# Patient Record
Sex: Male | Born: 2000 | Race: Black or African American | Hispanic: No | Marital: Single | Smoking: Never smoker
Health system: Southern US, Community
[De-identification: ages and names within clinical notes are randomized; demographics above are authoritative.]

## PROBLEM LIST (undated history)

## (undated) ENCOUNTER — Ambulatory Visit (HOSPITAL_COMMUNITY): Admission: EM | Disposition: A | Discharge status: Home / Self Care | Payer: Medicaid Other

## (undated) DIAGNOSIS — Z8659 Personal history of other mental and behavioral disorders: Secondary | ICD-10-CM

## (undated) DIAGNOSIS — J302 Other seasonal allergic rhinitis: Secondary | ICD-10-CM

## (undated) DIAGNOSIS — F419 Anxiety disorder, unspecified: Secondary | ICD-10-CM

## (undated) HISTORY — DX: Personal history of other mental and behavioral disorders: Z86.59

## (undated) HISTORY — DX: Anxiety disorder, unspecified: F41.9

---
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
## 2020-01-08 ENCOUNTER — Other Ambulatory Visit: Payer: Medicaid Other

## 2020-01-08 ENCOUNTER — Other Ambulatory Visit: Payer: Self-pay

## 2020-01-08 DIAGNOSIS — Z20822 Contact with and (suspected) exposure to covid-19: Secondary | ICD-10-CM

## 2020-01-09 LAB — SARS-COV-2, NAA 2 DAY TAT

## 2020-01-09 LAB — NOVEL CORONAVIRUS, NAA: SARS-CoV-2, NAA: NOT DETECTED

## 2020-04-03 IMAGING — DX DG HAND COMPLETE 3+V*L*
3 series · 3 of 3 positions shown · non-contrast
Comparison: None.

CLINICAL DATA: Pain and bruising involving the second and third
metacarpals after an injury, initial encounter.

EXAM:
LEFT HAND - COMPLETE 3+ VIEW

[hand pa]
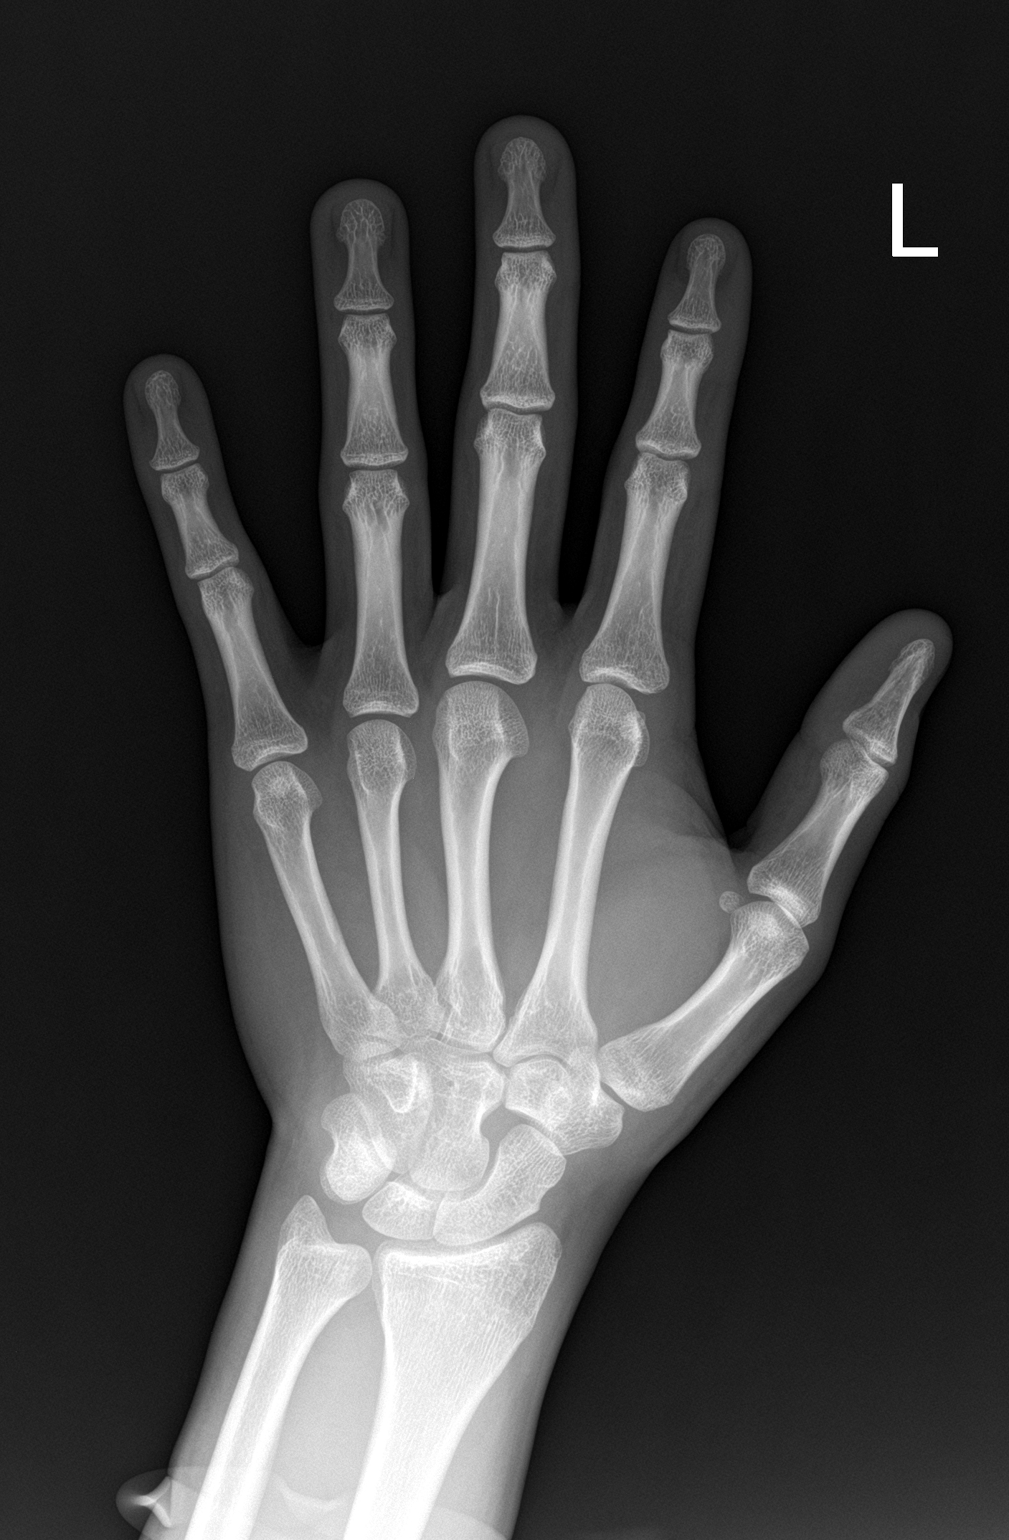

[hand obl]
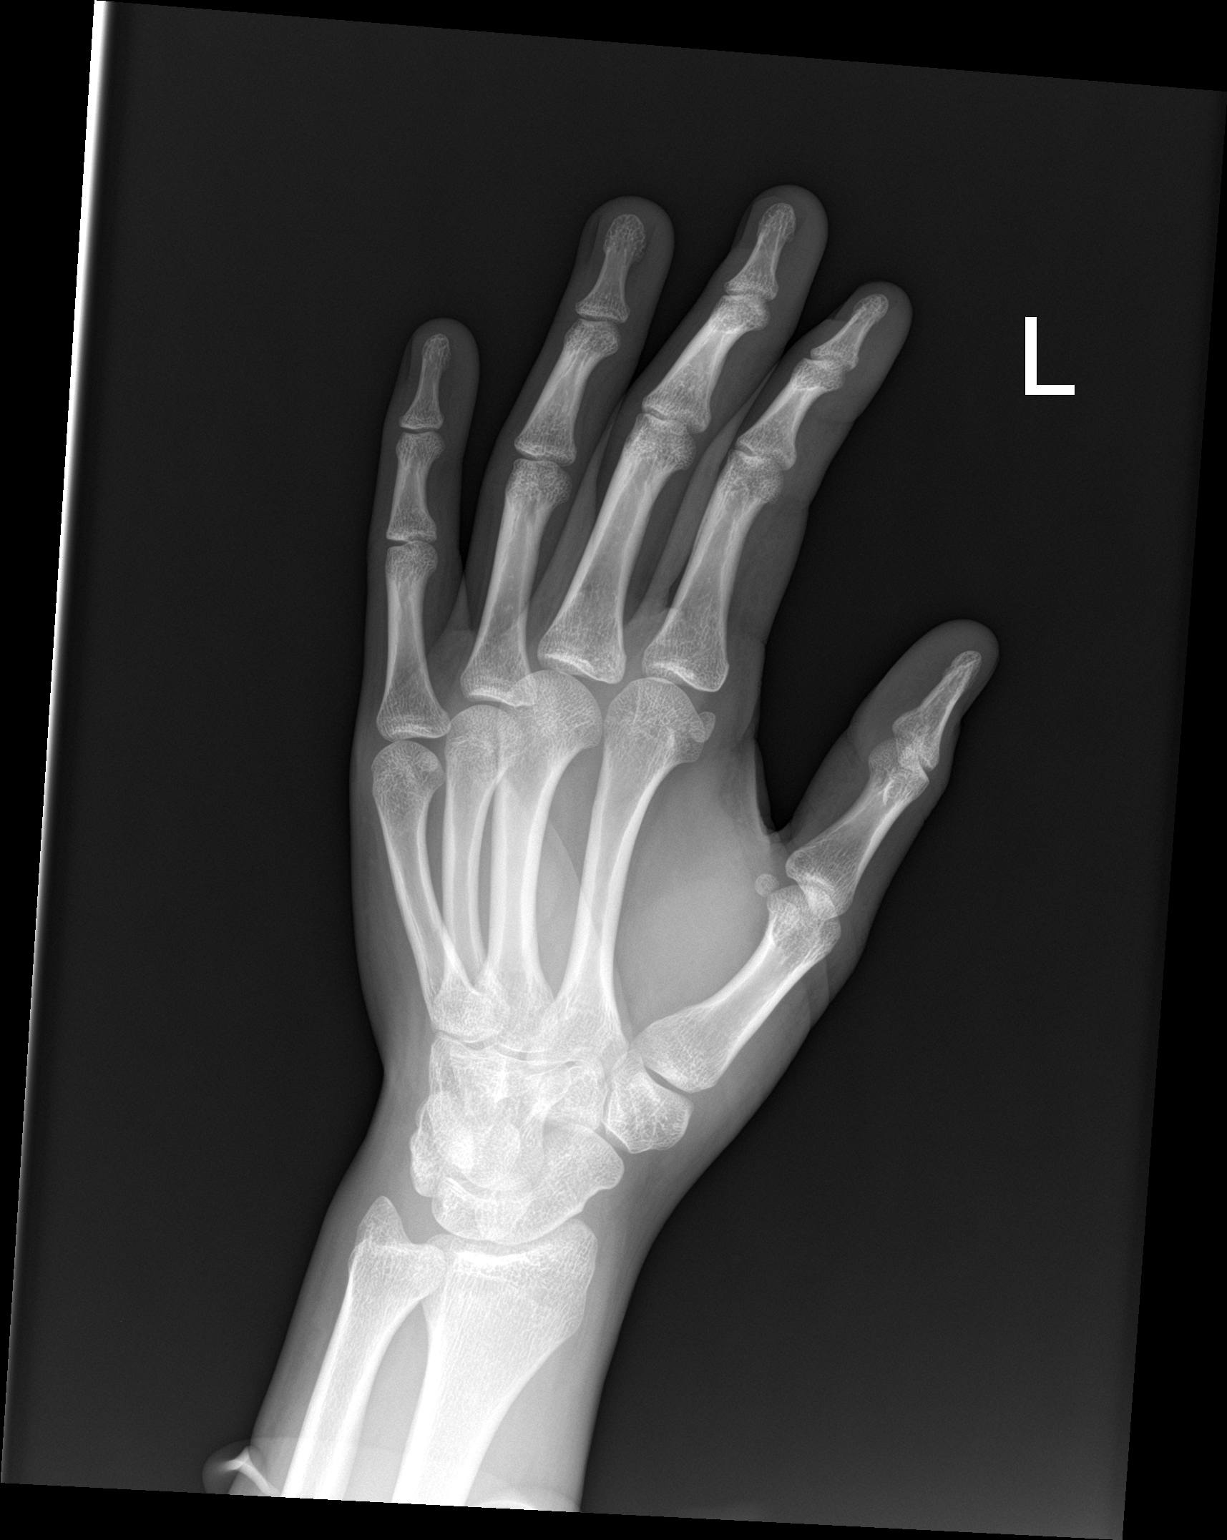

[hand lat]
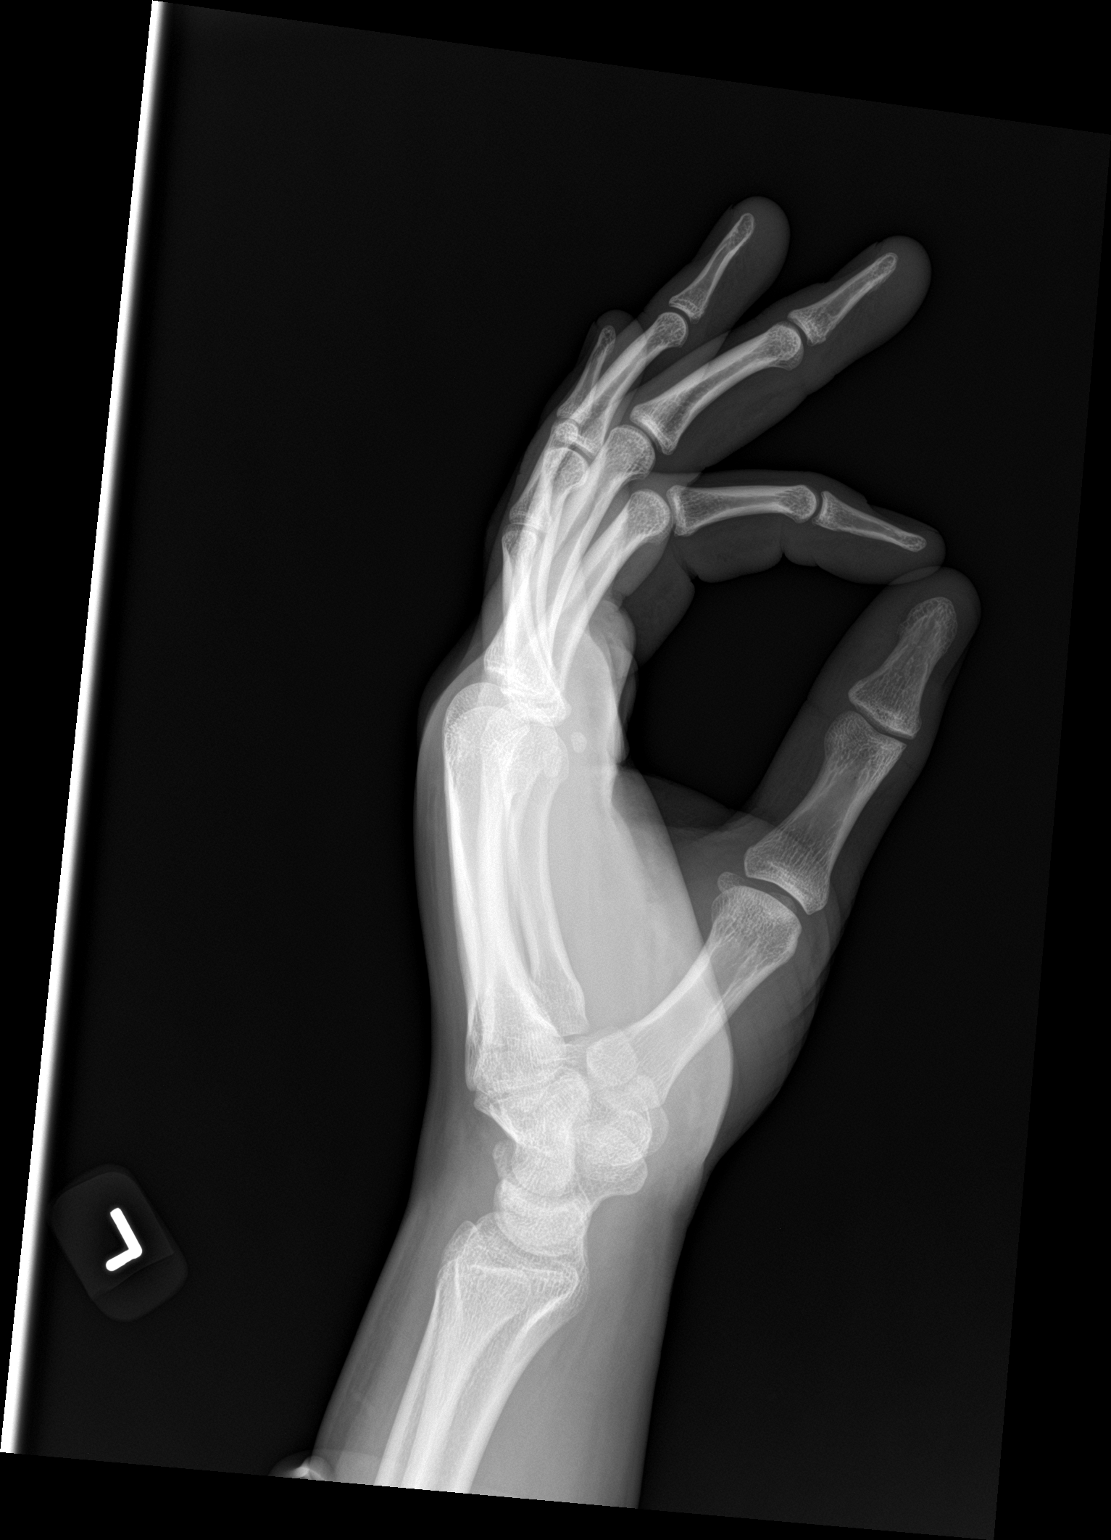

[3 of 3 positions shown; findings below may reference images not displayed]

FINDINGS: No acute osseous abnormality.  Dorsal soft tissue swelling.
IMPRESSION: Dorsal soft tissue swelling without acute osseous abnormality.

## 2020-08-22 ENCOUNTER — Other Ambulatory Visit: Payer: Self-pay

## 2020-08-22 ENCOUNTER — Encounter: Payer: Self-pay | Admitting: Family Medicine

## 2020-08-22 ENCOUNTER — Ambulatory Visit (INDEPENDENT_AMBULATORY_CARE_PROVIDER_SITE_OTHER): Payer: Medicaid Other | Admitting: Family Medicine

## 2020-08-22 VITALS — BP 116/70 | HR 63 | Ht 72.0 in | Wt 194.0 lb

## 2020-08-22 DIAGNOSIS — Z202 Contact with and (suspected) exposure to infections with a predominantly sexual mode of transmission: Secondary | ICD-10-CM | POA: Diagnosis not present

## 2020-08-22 DIAGNOSIS — Z0000 Encounter for general adult medical examination without abnormal findings: Secondary | ICD-10-CM

## 2020-08-22 DIAGNOSIS — H7193 Unspecified cholesteatoma, bilateral: Secondary | ICD-10-CM | POA: Diagnosis not present

## 2020-08-22 DIAGNOSIS — L609 Nail disorder, unspecified: Secondary | ICD-10-CM | POA: Diagnosis not present

## 2020-08-22 DIAGNOSIS — Z8669 Personal history of other diseases of the nervous system and sense organs: Secondary | ICD-10-CM

## 2020-08-22 DIAGNOSIS — Z09 Encounter for follow-up examination after completed treatment for conditions other than malignant neoplasm: Secondary | ICD-10-CM

## 2020-08-22 LAB — POCT URINALYSIS DIPSTICK
Bilirubin, UA: NEGATIVE
Blood, UA: NEGATIVE
Glucose, UA: NEGATIVE
Ketones, UA: NEGATIVE
Leukocytes, UA: NEGATIVE
Nitrite, UA: NEGATIVE
Protein, UA: NEGATIVE
Spec Grav, UA: 1.025 (ref 1.010–1.025)
Urobilinogen, UA: 0.2 E.U./dL
pH, UA: 6 (ref 5.0–8.0)

## 2020-08-22 NOTE — Progress Notes (Signed)
[facility]   Annual Physical   Subjective:  Patient ID: James [name], male    DOB: 04/30/2001  Age: 20 y.o. MRN: [id number]  CC:  Chief Complaint  Patient presents with  . Annual Exam  . Nail Problem  . Knee Problem    HPI James [name] is a 20 year old male who presents for Annual Physical today.   Patient Active Problem List   Diagnosis Date Noted  . Acute otitis media 02/13/2019   Current Status: Since her last office visit, she is doing well with no complaints. She denies fevers, chills, fatigue, recent infections, weight loss, and night sweats. She has not had any headaches, visual changes, dizziness, and falls. No chest pain, heart palpitations, cough and shortness of breath reported. Denies GI problems such as nausea, vomiting, diarrhea, and constipation. She has no reports of blood in stools, dysuria and hematuria. No depression or anxiety reported today. She is taking all medications as prescribed. She denies pain today.   Past Medical History:  Diagnosis Date  . Anxiety   . History of depression   . Seasonal allergies 02/2019    Past Surgical History:  Procedure Laterality Date  . WISDOM TOOTH EXTRACTION  2017    Family History  Problem Relation Age of Onset  . Diabetes Mother     Social History   Socioeconomic History  . Marital status: Single    Spouse name: Not on file  . Number of children: Not on file  . Years of education: Not on file  . Highest education level: Not on file  Occupational History  . Not on file  Tobacco Use  . Smoking status: Never Smoker  . Smokeless tobacco: Never Used  Vaping Use  . Vaping Use: Never used  Substance and Sexual Activity  . Alcohol use: Not Currently  . Drug use: Never  . Sexual activity: Yes    Birth control/protection: Condom  Other Topics Concern  . Not on file  Social History Narrative  . Not on file   Social Determinants of Health   Financial  Resource Strain: Low Risk   . Difficulty of Paying Living Expenses: Not hard at all  Food Insecurity: No Food Insecurity  . Worried About Programme researcher, broadcasting/film/video in the Last Year: Never true  . Ran Out of Food in the Last Year: Never true  Transportation Needs: No Transportation Needs  . Lack of Transportation (Medical): No  . Lack of Transportation (Non-Medical): No  Physical Activity: Inactive  . Days of Exercise per Week: 0 days  . Minutes of Exercise per Session: 0 min  Stress: No Stress Concern Present  . Feeling of Stress : Only a little  Social Connections: Unknown  . Frequency of Communication with Friends and Family: Once a week  . Frequency of Social Gatherings with Friends and Family: Once a week  . Attends Religious Services: Never  . Active Member of Clubs or Organizations: No  . Attends Banker Meetings: Never  . Marital Status: Not on file  Intimate Partner Violence: Not At Risk  . Fear of Current or Ex-Partner: No  . Emotionally Abused: No  . Physically Abused: No  . Sexually Abused: No    Outpatient Medications Prior to Visit  Medication Sig Dispense Refill  . ibuprofen (ADVIL) 200 MG tablet Take 200 mg by mouth every 6 (six) hours as needed.    . cyclobenzaprine (FLEXERIL) 5 MG tablet  Take 1-2 tablets (5-10 mg total) by mouth 2 (two) times daily as needed for muscle spasms. 24 tablet 0   No facility-administered medications prior to visit.    Allergies  Allergen Reactions  . Latex Itching   ROS Review of Systems  Constitutional: Negative.   HENT: Negative.   Eyes: Negative.   Respiratory: Negative.   Cardiovascular: Negative.   Gastrointestinal: Negative.   Endocrine: Negative.   Genitourinary: Negative.   Musculoskeletal: Negative.   Skin: Negative.   Allergic/Immunologic: Negative.   Neurological: Negative.   Hematological: Negative.   Psychiatric/Behavioral: Negative.     Objective:    Physical Exam Vitals and nursing note  reviewed.  Constitutional:      Appearance: Normal appearance.  HENT:     Head: Normocephalic and atraumatic.     Ears:      Nose: Nose normal.     Mouth/Throat:     Mouth: Mucous membranes are moist.     Pharynx: Oropharynx is clear.  Cardiovascular:     Rate and Rhythm: Normal rate and regular rhythm.     Pulses: Normal pulses.     Heart sounds: Normal heart sounds.  Pulmonary:     Effort: Pulmonary effort is normal.     Breath sounds: Normal breath sounds.  Abdominal:     General: Bowel sounds are normal.     Palpations: Abdomen is soft.  Musculoskeletal:        General: Normal range of motion.     Cervical back: Normal range of motion and neck supple.  Skin:    General: Skin is warm and dry.  Neurological:     General: No focal deficit present.     Mental Status: He is alert and oriented to person, place, and time.  Psychiatric:        Mood and Affect: Mood normal.        Behavior: Behavior normal.        Thought Content: Thought content normal.        Judgment: Judgment normal.    BP 112/69   Pulse 63   Ht 6' (1.829 m)   Wt 194 lb (88 kg)   SpO2 99%   BMI 26.31 kg/m  Wt Readings from Last 3 Encounters:  08/22/20 194 lb (88 kg) (90 %, Z= 1.28)*  11/23/19 184 lb (83.5 kg) (86 %, Z= 1.09)*  09/27/19 180 lb (81.6 kg) (84 %, Z= 0.99)*   * Growth percentiles are based on CDC (Boys, 2-20 Years) data.     Health Maintenance Due  Topic Date Due  . Hepatitis C Screening  Never done  . HIV Screening  Never done  . TETANUS/TDAP  Never done    There are no preventive care reminders to display for this patient.  No results found for: TSH Lab Results  Component Value Date   WBC 8.0 02/13/2019   HGB 15.3 02/13/2019   HCT 44.4 02/13/2019   MCV 88 02/13/2019   PLT 263 02/13/2019   Lab Results  Component Value Date   NA 140 02/13/2019   K 4.3 02/13/2019   CO2 25 02/13/2019   GLUCOSE 100 (H) 02/13/2019   BUN 9 02/13/2019   CREATININE 0.99 02/13/2019    BILITOT 1.2 02/13/2019   ALKPHOS 74 02/13/2019   AST 15 02/13/2019   ALT 18 02/13/2019   PROT 7.1 02/13/2019   ALBUMIN 4.6 02/13/2019   CALCIUM 9.9 02/13/2019   No results found for: CHOL No results found  for: HDL No results found for: LDLCALC No results found for: TRIG No results found for: CHOLHDL No results found for: HENI7P  Assessment & Plan:   1. Annual physical exam Physical assessment within normal for age. Basic Neurology assessment normal.  Recommend monthly testicular exam Recommend strength training in 150 minutes of cardiovascular exercise per week - Urinalysis Dipstick - Comprehensive metabolic panel; Future  2. Abnormality of nail surface  3. Possible exposure to STD - Chlamydia/Gonococcus/Trichomonas, NAA - Urinalysis Dipstick - STD Screen (8)  4. Cholesteatoma of both middle ears Chronic ear problems r/t his history of frequent infections.  - Ambulatory referral to ENT  5. History of chronic ear infection - Ambulatory referral to ENT  6. Healthcare maintenance - CBC with Differential - Comprehensive metabolic panel; Future  7. Follow up He will follow up in 1 year for Annual Physical and Labwork.  No orders of the defined types were placed in this encounter.  Orders Placed This Encounter  Procedures  . Chlamydia/Gonococcus/Trichomonas, NAA  . CBC with Differential  . Comprehensive metabolic panel  . STD Screen (8)  . Ambulatory referral to ENT  . Urinalysis Dipstick    Referral Orders     Ambulatory referral to ENT   Raliegh Ip, MSN, ANE, FNP-BC [facility] [address] [place], [place] [address] [phone]- fax  Problem List Items Addressed This Visit   None   Visit Diagnoses    Annual physical exam    -  Primary   Relevant Orders   Urinalysis Dipstick   Comprehensive metabolic panel   Abnormality of nail surface        Possible exposure to STD       Relevant Orders   Chlamydia/Gonococcus/Trichomonas, NAA   Urinalysis Dipstick   STD Screen (8)   Cholesteatoma of both middle ears       Relevant Orders   Ambulatory referral to ENT   History of chronic ear infection       Relevant Orders   Ambulatory referral to ENT   Healthcare maintenance       Relevant Orders   CBC with Differential   Comprehensive metabolic panel   Follow up         No orders of the defined types were placed in this encounter.  Follow-up: No follow-ups on file.   Kallie Locks, FNP

## 2020-08-23 ENCOUNTER — Encounter: Payer: Self-pay | Admitting: Family Medicine

## 2020-08-23 LAB — STD SCREEN (8)
HIV Screen 4th Generation wRfx: NONREACTIVE
HSV 1 Glycoprotein G Ab, IgG: <0.91 index (ref 0.00–0.90)
HSV 2 IgG, Type Spec: <0.91 index (ref 0.00–0.90)
Hep A IgM: NEGATIVE
Hep B C IgM: NEGATIVE
Hep C Virus Ab: <0.1 s/co ratio (ref 0.0–0.9)
Hepatitis B Surface Ag: NEGATIVE
RPR Ser Ql: NONREACTIVE

## 2020-08-23 LAB — CBC WITH DIFFERENTIAL/PLATELET
Basophils Absolute: 0.1 10*3/uL (ref 0.0–0.2)
Basos: 1 %
EOS (ABSOLUTE): 0.1 10*3/uL (ref 0.0–0.4)
Eos: 1 %
Hematocrit: 48.8 % (ref 37.5–51.0)
Hemoglobin: 16.5 g/dL (ref 13.0–17.7)
Immature Grans (Abs): 0 10*3/uL (ref 0.0–0.1)
Immature Granulocytes: 0 %
Lymphocytes Absolute: 2.8 10*3/uL (ref 0.7–3.1)
Lymphs: 43 %
MCH: 30.2 pg (ref 26.6–33.0)
MCHC: 33.8 g/dL (ref 31.5–35.7)
MCV: 89 fL (ref 79–97)
Monocytes Absolute: 0.6 10*3/uL (ref 0.1–0.9)
Monocytes: 9 %
Neutrophils Absolute: 2.9 10*3/uL (ref 1.4–7.0)
Neutrophils: 46 %
Platelets: 241 10*3/uL (ref 150–450)
RBC: 5.47 x10E6/uL (ref 4.14–5.80)
RDW: 12.1 % (ref 11.6–15.4)
WBC: 6.4 10*3/uL (ref 3.4–10.8)

## 2020-08-24 LAB — CHLAMYDIA/GONOCOCCUS/TRICHOMONAS, NAA
Chlamydia by NAA: NEGATIVE
Gonococcus by NAA: NEGATIVE
Trich vag by NAA: NEGATIVE

## 2020-09-07 ENCOUNTER — Encounter: Payer: Self-pay | Admitting: Family Medicine

## 2020-10-08 DIAGNOSIS — Z20822 Contact with and (suspected) exposure to covid-19: Secondary | ICD-10-CM | POA: Diagnosis not present

## 2021-01-08 DIAGNOSIS — Z113 Encounter for screening for infections with a predominantly sexual mode of transmission: Secondary | ICD-10-CM | POA: Diagnosis not present

## 2021-06-03 ENCOUNTER — Ambulatory Visit (HOSPITAL_COMMUNITY)
Admission: EM | Admit: 2021-06-03 | Discharge: 2021-06-03 | Disposition: A | Discharge status: Home / Self Care | Payer: Medicaid Other | Attending: Internal Medicine | Admitting: Internal Medicine

## 2021-06-03 ENCOUNTER — Other Ambulatory Visit: Payer: Self-pay

## 2021-06-03 DIAGNOSIS — Z20822 Contact with and (suspected) exposure to covid-19: Secondary | ICD-10-CM | POA: Diagnosis not present

## 2021-06-03 NOTE — Discharge Instructions (Addendum)
Continue adequate hydration Tylenol/Motrin as needed for pain and/or fever We will call you with recommendations if test results are abnormal Return to urgent care if you have worsening symptoms.

## 2021-06-03 NOTE — ED Triage Notes (Signed)
Pt presents to office for cough,congestion,body aches and red eye x 2 days.

## 2021-06-03 NOTE — ED Provider Notes (Signed)
[facility]    CSN: [id number] Arrival date & time: 06/03/21  1321      History   Chief Complaint Chief Complaint  Patient presents with   Cough    Congestion    Fever    HPI James [name] is a 20 y.o. male comes to urgent care for COVID-19 testing.  Patient was exposed to a confirmed COVID-19 case a few days ago.  Patient has runny nose and a mild cough.  No fever or chills.  No generalized body aches.  No sore throat.Marland Kitchen   HPI  Past Medical History:  Diagnosis Date   Anxiety    History of depression    Seasonal allergies 02/2019    Patient Active Problem List   Diagnosis Date Noted   Acute otitis media 02/13/2019    Past Surgical History:  Procedure Laterality Date   WISDOM TOOTH EXTRACTION  2017       Home Medications    Prior to Admission medications   Medication Sig Start Date End Date Taking? Authorizing Provider  ibuprofen (ADVIL) 200 MG tablet Take 200 mg by mouth every 6 (six) hours as needed.    [provider]  cetirizine (ZYRTEC) 10 MG tablet Take 1 tablet (10 mg total) by mouth daily. Patient not taking: Reported on 09/27/2019 09/04/19 11/17/19  Dahlia Byes A, NP  fluticasone (FLONASE) 50 MCG/ACT nasal spray Place 1 spray into both nostrils daily. Patient not taking: Reported on 09/27/2019 09/04/19 11/17/19  Janace Aris, NP    Family History Family History  Problem Relation Age of Onset   Diabetes Mother     Social History Social History   Tobacco Use   Smoking status: Never   Smokeless tobacco: Never  Vaping Use   Vaping Use: Never used  Substance Use Topics   Alcohol use: Not Currently   Drug use: Never     Allergies   Latex   Review of Systems Review of Systems As per HPI  Physical Exam Triage Vital Signs ED Triage Vitals  Enc Vitals Group     BP 06/03/21 1738 129/64     Pulse Rate 06/03/21 1738 76     Resp 06/03/21 1738 18     Temp 06/03/21 1738 97.8 F (36.6 C)     Temp Source 06/03/21 1738  Oral     SpO2 06/03/21 1738 100 %     Weight --      Height --      Head Circumference --      Peak Flow --      Pain Score 06/03/21 1739 0     Pain Loc --      Pain Edu? --      Excl. in GC? --    No data found.  Updated Vital Signs BP 129/64 (BP Location: Left Arm)    Pulse 76    Temp 97.8 F (36.6 C) (Oral)    Resp 18    SpO2 100%   Visual Acuity Right Eye Distance:   Left Eye Distance:   Bilateral Distance:    Right Eye Near:   Left Eye Near:    Bilateral Near:     Physical Exam Constitutional:      General: He is not in acute distress.    Appearance: Normal appearance. He is not ill-appearing.  Cardiovascular:     Rate and Rhythm: Normal rate and regular rhythm.     Pulses: Normal pulses.     Heart  sounds: Normal heart sounds.  Pulmonary:     Effort: Pulmonary effort is normal.     Breath sounds: Normal breath sounds.  Abdominal:     General: Bowel sounds are normal.     Palpations: Abdomen is soft.  Neurological:     Mental Status: He is alert.     UC Treatments / Results  Labs (all labs ordered are listed, but only abnormal results are displayed) Labs Reviewed  SARS CORONAVIRUS 2 (TAT 6-24 HRS)    EKG   Radiology No results found.  Procedures Procedures (including critical care time)  Medications Ordered in UC Medications - No data to display  Initial Impression / Assessment and Plan / UC Course  I have reviewed the triage vital signs and the nursing notes.  Pertinent labs & imaging results that were available during my care of the patient were reviewed by me and considered in my medical decision making (see chart for details).     1.  Exposure to a confirmed COVID-19 case: Patient likely has COVID-19 infection COVID-19 PCR test has been sent We will call patient with recommendations Supportive care with analgesics/antipyretics Increase oral fluid intake Return to urgent care if symptoms worsen. Final Clinical Impressions(s) / UC  Diagnoses   Final diagnoses:  Exposure to confirmed case of COVID-19     Discharge Instructions      Continue adequate hydration Tylenol/Motrin as needed for pain and/or fever We will call you with recommendations if test results are abnormal Return to urgent care if you have worsening symptoms.   ED Prescriptions   None    PDMP not reviewed this encounter.   Merrilee Jansky, MD 06/03/21 [phone]

## 2021-06-04 LAB — SARS CORONAVIRUS 2 (TAT 6-24 HRS): SARS Coronavirus 2: NEGATIVE

## 2021-08-24 ENCOUNTER — Encounter: Payer: Medicaid Other | Admitting: Family Medicine

## 2021-08-24 ENCOUNTER — Encounter: Payer: Medicaid Other | Admitting: Nurse Practitioner

## 2021-12-09 DIAGNOSIS — N50811 Right testicular pain: Secondary | ICD-10-CM | POA: Diagnosis not present

## 2022-01-05 ENCOUNTER — Encounter (HOSPITAL_COMMUNITY): Payer: Self-pay

## 2022-01-05 ENCOUNTER — Ambulatory Visit (HOSPITAL_COMMUNITY)
Admission: EM | Admit: 2022-01-05 | Discharge: 2022-01-05 | Disposition: A | Discharge status: Home / Self Care | Payer: Medicaid Other

## 2022-01-05 DIAGNOSIS — N5089 Other specified disorders of the male genital organs: Secondary | ICD-10-CM

## 2022-01-05 NOTE — ED Triage Notes (Signed)
Pt states right testicular pain for the past month also states that he noticed a bump on the inside of it.

## 2022-01-05 NOTE — Discharge Instructions (Addendum)
Please call the urology office tomorrow morning to get an appointment scheduled for as soon as possible.

## 2022-01-06 DIAGNOSIS — N50811 Right testicular pain: Secondary | ICD-10-CM | POA: Diagnosis not present

## 2022-01-06 DIAGNOSIS — D2930 Benign neoplasm of unspecified epididymis: Secondary | ICD-10-CM | POA: Diagnosis not present

## 2022-01-06 NOTE — ED Provider Notes (Addendum)
Renaldo Fiddler    CSN: [id number] Arrival date & time: 01/05/22  1837      History   Chief Complaint Chief Complaint  Patient presents with   Testicle Pain    HPI James [name] is a 21 y.o. male. Pt states right testicular pain for the past month also states that he noticed a bump on the inside of it.The pain is intermittent and is a sharp pain that lasts only a second - he feels the pain in his R inguinal area. The testicular lump is not painful. Denies penile discharge. Denies sores or lesions in his groin area. Denies hx STIs. Is sexually active with male partner. Reports ejaculation and urination are normal.   Review of records shows pt was seen at [facility] 12/09/21 for testicular pain and tested negative for GC/chlamydia or trich at that time.    Testicle Pain    Past Medical History:  Diagnosis Date   Anxiety    History of depression    Seasonal allergies 02/2019    Patient Active Problem List   Diagnosis Date Noted   Acute otitis media 02/13/2019    Past Surgical History:  Procedure Laterality Date   WISDOM TOOTH EXTRACTION  2017       Home Medications    Prior to Admission medications   Medication Sig Start Date End Date Taking? Authorizing Provider  ibuprofen (ADVIL) 200 MG tablet Take 200 mg by mouth every 6 (six) hours as needed.    [provider]  cetirizine (ZYRTEC) 10 MG tablet Take 1 tablet (10 mg total) by mouth daily. Patient not taking: Reported on 09/27/2019 09/04/19 11/17/19  Dahlia Byes A, NP  fluticasone (FLONASE) 50 MCG/ACT nasal spray Place 1 spray into both nostrils daily. Patient not taking: Reported on 09/27/2019 09/04/19 11/17/19  Janace Aris, NP    Family History Family History  Problem Relation Age of Onset   Diabetes Mother     Social History Social History   Tobacco Use   Smoking status: Never   Smokeless tobacco: Never  Vaping Use   Vaping Use: Never used  Substance Use Topics   Alcohol use: Not  Currently   Drug use: Never     Allergies   Latex   Review of Systems Review of Systems  Genitourinary:  Positive for testicular pain.     Physical Exam Triage Vital Signs ED Triage Vitals  Enc Vitals Group     BP 01/05/22 1911 129/67     Pulse Rate 01/05/22 1911 84     Resp 01/05/22 1911 16     Temp 01/05/22 1911 98.3 F (36.8 C)     Temp Source 01/05/22 1911 Oral     SpO2 01/05/22 1911 98 %     Weight --      Height --      Head Circumference --      Peak Flow --      Pain Score 01/05/22 1912 4     Pain Loc --      Pain Edu? --      Excl. in GC? --    No data found.  Updated Vital Signs BP 129/67 (BP Location: Left Arm)   Pulse 84   Temp 98.3 F (36.8 C) (Oral)   Resp 16   SpO2 98%   Visual Acuity Right Eye Distance:   Left Eye Distance:   Bilateral Distance:    Right Eye Near:   Left Eye Near:  Bilateral Near:     Physical Exam Exam conducted with a chaperone present.  Constitutional:      General: He is not in acute distress.    Appearance: Normal appearance. He is not ill-appearing.  Pulmonary:     Effort: Pulmonary effort is normal.  Abdominal:     Hernia: There is no hernia in the left inguinal area or right inguinal area.  Genitourinary:    Penis: Normal and circumcised.      Testes:        Right: Mass present.     Comments: Small ~38mm irregular, hard lump on surface of R testicle. Scrotum is normal Neurological:     Mental Status: He is alert.      UC Treatments / Results  Labs (all labs ordered are listed, but only abnormal results are displayed) Labs Reviewed - No data to display  EKG   Radiology No results found.  Procedures Procedures (including critical care time)  Medications Ordered in UC Medications - No data to display  Initial Impression / Assessment and Plan / UC Course  I have reviewed the triage vital signs and the nursing notes.  Pertinent labs & imaging results that were available during my care of  the patient were reviewed by me and considered in my medical decision making (see chart for details).     Provided contact info for urology. Explained urgent nature of contacting urology for appointment asap.   Final Clinical Impressions(s) / UC Diagnoses   Final diagnoses:  Testicular lump     Discharge Instructions      Please call the urology office tomorrow morning to get an appointment scheduled for as soon as possible.    ED Prescriptions   None    PDMP not reviewed this encounter.   Cathlyn Parsons, NP 01/06/22 0810    Cathlyn Parsons, NP 01/06/22 [phone]

## 2022-02-04 ENCOUNTER — Other Ambulatory Visit: Payer: Self-pay | Admitting: Urology

## 2022-02-04 DIAGNOSIS — N50811 Right testicular pain: Secondary | ICD-10-CM

## 2022-02-21 ENCOUNTER — Ambulatory Visit
Admission: RE | Admit: 2022-02-21 | Discharge: 2022-02-21 | Disposition: A | Discharge status: Home / Self Care | Payer: Medicaid Other | Source: Ambulatory Visit | Attending: Urology | Admitting: Urology

## 2022-02-21 DIAGNOSIS — N50811 Right testicular pain: Secondary | ICD-10-CM

## 2022-02-21 MED ORDER — GADOBENATE DIMEGLUMINE 529 MG/ML IV SOLN
18.0000 mL | Freq: Once | INTRAVENOUS | Status: AC | PRN
  Administered 2022-02-21: 17 mL via INTRAVENOUS

## 2022-03-17 ENCOUNTER — Encounter: Payer: Self-pay | Admitting: Nurse Practitioner

## 2022-03-17 ENCOUNTER — Ambulatory Visit (INDEPENDENT_AMBULATORY_CARE_PROVIDER_SITE_OTHER): Payer: Medicaid Other | Admitting: Nurse Practitioner

## 2022-03-17 VITALS — BP 115/75 | HR 91 | Temp 98.3°F | Wt 196.2 lb

## 2022-03-17 DIAGNOSIS — R0982 Postnasal drip: Secondary | ICD-10-CM

## 2022-03-17 DIAGNOSIS — Z7689 Persons encountering health services in other specified circumstances: Secondary | ICD-10-CM | POA: Diagnosis not present

## 2022-03-17 MED ORDER — CETIRIZINE HCL 10 MG PO TABS: 10.0000 mg | ORAL_TABLET | Freq: Every day | ORAL | 11 refills | Status: AC

## 2022-03-17 NOTE — Progress Notes (Signed)
@Patient  ID: James [name], male    DOB: June 18, 2000, 21 y.o.   MRN: [id number]  Chief Complaint  Patient presents with   Establish Care    No concerns.    Referring provider: No ref. provider found   HPI  Patient presents today to establish care.  He has no significant health history.  Patient states that the only issue have frequent throat clearing.  We will trial Zyrtec.  Patient has not had a physical in a while but declines blood work today. Denies f/c/s, n/v/d, hemoptysis, PND, leg swelling Denies chest pain or edema       Allergies  Allergen Reactions   Latex Itching    Immunization History  Administered Date(s) Administered   DTaP 08/29/2001, 10/30/2001, 12/22/2001, 08/21/2002, 03/05/2005   HIB, Unspecified 08/29/2001, 10/30/2001, 12/22/2001, 04/26/2002   HPV Quadrivalent 01/19/2013, 03/26/2014   Hepatitis A, Ped/Adol-2 Dose 03/28/2006, 09/28/2007   Hepatitis B, PED/ADOLESCENT 2000/06/12, 08/29/2001, 12/22/2001   IPV 08/29/2001, 10/30/2001, 08/21/2002, 03/05/2005   Influenza,inj,Quad PF,6+ Mos 05/08/2002   MMR 04/26/2002, 03/05/2005   Meningococcal Acwy, Unspecified 01/19/2013, 01/20/2017   Meningococcal B, OMV 01/20/2017, 09/06/2017   Pneumococcal Conjugate PCV 7 08/29/2001, 10/30/2001, 12/22/2001, 04/26/2002   Tdap 01/17/2012   Varicella 04/26/2002, 03/28/2006    Past Medical History:  Diagnosis Date   Anxiety    History of depression    Seasonal allergies 02/2019    Tobacco History: Social History   Tobacco Use  Smoking Status Never  Smokeless Tobacco Never   Counseling given: Not Answered   Outpatient Encounter Medications as of 03/17/2022  Medication Sig   cetirizine (ZYRTEC) 10 MG tablet Take 1 tablet (10 mg total) by mouth daily.   FLUoxetine (PROZAC) 20 MG capsule Take by mouth.   ibuprofen (ADVIL) 200 MG tablet Take 200 mg by mouth every 6 (six) hours as needed.   meloxicam (MOBIC) 7.5 MG tablet Take 7.5-15 mg by mouth daily.    [DISCONTINUED] cetirizine (ZYRTEC) 10 MG tablet Take 1 tablet (10 mg total) by mouth daily. (Patient not taking: Reported on 09/27/2019)   [DISCONTINUED] fluticasone (FLONASE) 50 MCG/ACT nasal spray Place 1 spray into both nostrils daily. (Patient not taking: Reported on 09/27/2019)   No facility-administered encounter medications on file as of 03/17/2022.     Review of Systems  Review of Systems  Constitutional: Negative.   HENT: Negative.    Cardiovascular: Negative.   Gastrointestinal: Negative.   Allergic/Immunologic: Negative.   Neurological: Negative.   Psychiatric/Behavioral: Negative.         Physical Exam  BP 115/75   Pulse 91   Temp 98.3 F (36.8 C) (Temporal)   Wt 196 lb 3.2 oz (89 kg)   SpO2 98%   BMI 26.61 kg/m   Wt Readings from Last 5 Encounters:  03/17/22 196 lb 3.2 oz (89 kg)  08/22/20 194 lb (88 kg) (90 %, Z= 1.28)*  11/23/19 184 lb (83.5 kg) (86 %, Z= 1.09)*  09/27/19 180 lb (81.6 kg) (84 %, Z= 0.99)*  09/16/19 177 lb (80.3 kg) (82 %, Z= 0.91)*   * Growth percentiles are based on CDC (Boys, 2-20 Years) data.     Physical Exam Vitals and nursing note reviewed.  Constitutional:      General: He is not in acute distress.    Appearance: He is well-developed.  Cardiovascular:     Rate and Rhythm: Normal rate and regular rhythm.  Pulmonary:     Effort: Pulmonary effort is normal.  Breath sounds: Normal breath sounds.  Skin:    General: Skin is warm and dry.  Neurological:     Mental Status: He is alert and oriented to person, place, and time.      Lab Results:  CBC    Component Value Date/Time   WBC 6.4 08/22/2020 1533   RBC 5.47 08/22/2020 1533   HGB 16.5 08/22/2020 1533   HCT 48.8 08/22/2020 1533   PLT 241 08/22/2020 1533   MCV 89 08/22/2020 1533   MCH 30.2 08/22/2020 1533   MCHC 33.8 08/22/2020 1533   RDW 12.1 08/22/2020 1533   LYMPHSABS 2.8 08/22/2020 1533   EOSABS 0.1 08/22/2020 1533   BASOSABS 0.1 08/22/2020 1533     BMET    Component Value Date/Time   NA 140 02/13/2019 1448   K 4.3 02/13/2019 1448   CL 102 02/13/2019 1448   CO2 25 02/13/2019 1448   GLUCOSE 100 (H) 02/13/2019 1448   BUN 9 02/13/2019 1448   CREATININE 0.99 02/13/2019 1448   CALCIUM 9.9 02/13/2019 1448   GFRNONAA 111 02/13/2019 1448   GFRAA 128 02/13/2019 1448    BNP No results found for: "BNP"  ProBNP No results found for: "PROBNP"  Imaging: MR PELVIS W WO CONTRAST  Result Date: 02/22/2022 CLINICAL DATA:  Testicular pain EXAM: MRI PELVIS WITHOUT AND WITH CONTRAST TECHNIQUE: Multiplanar multisequence MR imaging of the pelvis was performed both before and after administration of intravenous contrast. CONTRAST:  1m MULTIHANCE GADOBENATE DIMEGLUMINE 529 MG/ML IV SOLN COMPARISON:  Ultrasound 01/06/2022 FINDINGS: Urinary Tract:  No abnormality visualized. Bowel:  Unremarkable visualized pelvic bowel loops. Vascular/Lymphatic: No pathologically enlarged lymph nodes. No significant vascular abnormality seen. Reproductive:  Prostate gland is unremarkable. Right testicle: Measures 2.9 x 2.2 by 3.8 cm (volume = 13 cm^3). Uniform signal and enhancement. No mass identified. Left testicle: Measures 4.0 x 2.3 by 2.7 cm (volume = 13 cm^3). Uniform signal and enhancement. No focal mass identified. Hydrocele: Small, bilateral. Varicocele:  None Other:  None Musculoskeletal: No suspicious bone lesions identified. IMPRESSION: 1. Normal MRI of the testicles. 2. No extratesticular mass. 3. Small, bilateral hydrosalpinges. Electronically Signed   By: TKerby MoorsM.D.   On: 02/22/2022 16:16     Assessment & Plan:   Encounter to establish care 2. Post-nasal drip  - cetirizine (ZYRTEC) 10 MG tablet; Take 1 tablet (10 mg total) by mouth daily.  Dispense: 30 tablet; Refill: 11    Follow up:  Follow up in 1 year      TFenton Foy NP 03/17/2022

## 2022-03-17 NOTE — Patient Instructions (Addendum)
1. Encounter to establish care  2. Post-nasal drip  - cetirizine (ZYRTEC) 10 MG tablet; Take 1 tablet (10 mg total) by mouth daily.  Dispense: 30 tablet; Refill: 11    Follow up:  Follow up in 1 year

## 2022-03-17 NOTE — Assessment & Plan Note (Signed)
2. Post-nasal drip  - cetirizine (ZYRTEC) 10 MG tablet; Take 1 tablet (10 mg total) by mouth daily.  Dispense: 30 tablet; Refill: 11    Follow up:  Follow up in 1 year

## 2023-03-18 ENCOUNTER — Ambulatory Visit: Payer: Self-pay | Admitting: Nurse Practitioner

## 2023-04-15 ENCOUNTER — Encounter: Payer: Self-pay | Admitting: Nurse Practitioner

## 2023-04-15 ENCOUNTER — Ambulatory Visit (INDEPENDENT_AMBULATORY_CARE_PROVIDER_SITE_OTHER): Payer: Medicaid Other | Admitting: Nurse Practitioner

## 2023-04-15 VITALS — BP 120/69 | HR 81 | Ht 72.0 in | Wt 204.0 lb

## 2023-04-15 DIAGNOSIS — J302 Other seasonal allergic rhinitis: Secondary | ICD-10-CM

## 2023-04-15 DIAGNOSIS — Z13220 Encounter for screening for lipoid disorders: Secondary | ICD-10-CM

## 2023-04-15 DIAGNOSIS — Z0000 Encounter for general adult medical examination without abnormal findings: Secondary | ICD-10-CM | POA: Diagnosis not present

## 2023-04-15 MED ORDER — FEXOFENADINE HCL 180 MG PO TABS: 180.0000 mg | ORAL_TABLET | Freq: Every day | ORAL | 2 refills | Status: AC

## 2023-04-15 NOTE — Progress Notes (Signed)
Subjective   Patient ID: James [name], male    DOB: [birth year], 23 y.o.   MRN: [id number]  Chief Complaint  Patient presents with   Annual Exam    Referring provider: No ref. provider found  James [name] is a 22 y.o. male with Past Medical History: No date: Anxiety No date: History of depression 02/2019: Seasonal allergies   HPI  Patient presents today for physical.  Overall he has been doing well since last visit.  He states that he has been having some issues with intermittent depression related to a previous relationship.  He declines referral to psychiatry or depression medication at this time.  He states that he will follow-up if this worsens. Denies f/c/s, n/v/d, hemoptysis, PND, leg swelling Denies chest pain or edema     Allergies  Allergen Reactions   Latex Itching    Immunization History  Administered Date(s) Administered   DTaP 08/29/2001, 10/30/2001, 12/22/2001, 08/21/2002, 03/05/2005   HIB, Unspecified 08/29/2001, 10/30/2001, 12/22/2001, 04/26/2002   HPV Quadrivalent 01/19/2013, 03/26/2014   Hepatitis A, Ped/Adol-2 Dose 03/28/2006, 09/28/2007   Hepatitis B, PED/ADOLESCENT 11-07-00, 08/29/2001, 12/22/2001   IPV 08/29/2001, 10/30/2001, 08/21/2002, 03/05/2005   Influenza,inj,Quad PF,6+ Mos 05/08/2002   MMR 04/26/2002, 03/05/2005   Meningococcal Acwy, Unspecified 01/19/2013, 01/20/2017   Meningococcal B, OMV 01/20/2017, 09/06/2017   Pneumococcal Conjugate PCV 7 08/29/2001, 10/30/2001, 12/22/2001, 04/26/2002   Tdap 01/17/2012   Varicella 04/26/2002, 03/28/2006    Tobacco History: Social History   Tobacco Use  Smoking Status Never  Smokeless Tobacco Never   Counseling given: Not Answered   Outpatient Encounter Medications as of 04/15/2023  Medication Sig   fexofenadine (ALLEGRA ALLERGY) 180 MG tablet Take 1 tablet (180 mg total) by mouth daily.   cetirizine (ZYRTEC) 10 MG tablet Take 1 tablet (10 mg total) by mouth daily. (Patient not taking:  Reported on 04/15/2023)   FLUoxetine (PROZAC) 20 MG capsule Take by mouth. (Patient not taking: Reported on 04/15/2023)   ibuprofen (ADVIL) 200 MG tablet Take 200 mg by mouth every 6 (six) hours as needed. (Patient not taking: Reported on 04/15/2023)   meloxicam (MOBIC) 7.5 MG tablet Take 7.5-15 mg by mouth daily. (Patient not taking: Reported on 04/15/2023)   [DISCONTINUED] fluticasone (FLONASE) 50 MCG/ACT nasal spray Place 1 spray into both nostrils daily. (Patient not taking: Reported on 09/27/2019)   No facility-administered encounter medications on file as of 04/15/2023.    Review of Systems  Review of Systems  Constitutional: Negative.   HENT: Negative.    Cardiovascular: Negative.   Gastrointestinal: Negative.   Allergic/Immunologic: Negative.   Neurological: Negative.   Psychiatric/Behavioral: Negative.       Objective:   BP 120/69   Pulse 81   Ht 6' (1.829 m)   Wt 204 lb (92.5 kg)   SpO2 98%   BMI 27.67 kg/m   Wt Readings from Last 5 Encounters:  04/15/23 204 lb (92.5 kg)  03/17/22 196 lb 3.2 oz (89 kg)  08/22/20 194 lb (88 kg) (90%, Z= 1.28)*  11/23/19 184 lb (83.5 kg) (86%, Z= 1.09)*  09/27/19 180 lb (81.6 kg) (84%, Z= 0.99)*   * Growth percentiles are based on CDC (Boys, 2-20 Years) data.     Physical Exam Vitals and nursing note reviewed.  Constitutional:      General: He is not in acute distress.    Appearance: He is well-developed.  Cardiovascular:     Rate and Rhythm: Normal rate and regular rhythm.  Pulmonary:  Effort: Pulmonary effort is normal.     Breath sounds: Normal breath sounds.  Skin:    General: Skin is warm and dry.  Neurological:     Mental Status: He is alert and oriented to person, place, and time.       Assessment & Plan:   Seasonal allergies -     Fexofenadine HCl; Take 1 tablet (180 mg total) by mouth daily.  Dispense: 30 tablet; Refill: 2  Lipid screening -     Lipid panel  Routine adult health maintenance -      CBC -     Comprehensive metabolic panel -     Lipid panel     Return in about 1 year (around 04/14/2024) for physical.   Ivonne Andrew, NP 04/15/2023

## 2023-04-15 NOTE — Patient Instructions (Addendum)
1. Seasonal allergies  - fexofenadine (ALLEGRA ALLERGY) 180 MG tablet; Take 1 tablet (180 mg total) by mouth daily.  Dispense: 30 tablet; Refill: 2   2. Lipid screening  - Lipid Panel   3. Routine adult health maintenance  - CBC - Comprehensive metabolic panel - Lipid Panel   Follow up:  Follow up in 1 year

## 2023-04-16 LAB — COMPREHENSIVE METABOLIC PANEL
ALT: 69 [IU]/L — ABNORMAL HIGH (ref 0–44)
AST: 28 [IU]/L (ref 0–40)
Albumin: 4.5 g/dL (ref 4.3–5.2)
Alkaline Phosphatase: 67 [IU]/L (ref 44–121)
BUN/Creatinine Ratio: 11 (ref 9–20)
BUN: 12 mg/dL (ref 6–20)
Bilirubin Total: 1.4 mg/dL — ABNORMAL HIGH (ref 0.0–1.2)
CO2: 21 mmol/L (ref 20–29)
Calcium: 9.8 mg/dL (ref 8.7–10.2)
Chloride: 103 mmol/L (ref 96–106)
Creatinine, Ser: 1.07 mg/dL (ref 0.76–1.27)
Globulin, Total: 2.5 g/dL (ref 1.5–4.5)
Glucose: 88 mg/dL (ref 70–99)
Potassium: 4.3 mmol/L (ref 3.5–5.2)
Sodium: 140 mmol/L (ref 134–144)
Total Protein: 7 g/dL (ref 6.0–8.5)
eGFR: 101 mL/min/{1.73_m2} (ref >59)

## 2023-04-16 LAB — LIPID PANEL
Chol/HDL Ratio: 3.4 ratio (ref 0.0–5.0)
Cholesterol, Total: 175 mg/dL (ref 100–199)
HDL: 52 mg/dL (ref >39)
LDL Chol Calc (NIH): 104 mg/dL — ABNORMAL HIGH (ref 0–99)
Triglycerides: 104 mg/dL (ref 0–149)
VLDL Cholesterol Cal: 19 mg/dL (ref 5–40)

## 2023-04-16 LAB — CBC
Hematocrit: 47 % (ref 37.5–51.0)
Hemoglobin: 15.5 g/dL (ref 13.0–17.7)
MCH: 30 pg (ref 26.6–33.0)
MCHC: 33 g/dL (ref 31.5–35.7)
MCV: 91 fL (ref 79–97)
Platelets: 236 10*3/uL (ref 150–450)
RBC: 5.17 x10E6/uL (ref 4.14–5.80)
RDW: 12.2 % (ref 11.6–15.4)
WBC: 6.1 10*3/uL (ref 3.4–10.8)

## 2023-04-18 ENCOUNTER — Other Ambulatory Visit: Payer: Self-pay | Admitting: Nurse Practitioner

## 2023-04-18 DIAGNOSIS — R945 Abnormal results of liver function studies: Secondary | ICD-10-CM

## 2023-05-18 ENCOUNTER — Other Ambulatory Visit: Payer: Medicaid Other

## 2023-05-18 DIAGNOSIS — R945 Abnormal results of liver function studies: Secondary | ICD-10-CM | POA: Diagnosis not present

## 2023-05-19 LAB — COMPREHENSIVE METABOLIC PANEL
ALT: 75 [IU]/L — ABNORMAL HIGH (ref 0–44)
AST: 39 [IU]/L (ref 0–40)
Albumin: 4.7 g/dL (ref 4.3–5.2)
Alkaline Phosphatase: 62 [IU]/L (ref 44–121)
BUN/Creatinine Ratio: 11 (ref 9–20)
BUN: 12 mg/dL (ref 6–20)
Bilirubin Total: 1.9 mg/dL — ABNORMAL HIGH (ref 0.0–1.2)
CO2: 24 mmol/L (ref 20–29)
Calcium: 9.5 mg/dL (ref 8.7–10.2)
Chloride: 102 mmol/L (ref 96–106)
Creatinine, Ser: 1.09 mg/dL (ref 0.76–1.27)
Globulin, Total: 2.5 g/dL (ref 1.5–4.5)
Glucose: 86 mg/dL (ref 70–99)
Potassium: 3.9 mmol/L (ref 3.5–5.2)
Sodium: 140 mmol/L (ref 134–144)
Total Protein: 7.2 g/dL (ref 6.0–8.5)
eGFR: 98 mL/min/{1.73_m2} (ref >59)

## 2023-05-19 NOTE — Progress Notes (Signed)
My chart message was sent to pt after confirming last activity date.  KH

## 2024-04-16 ENCOUNTER — Ambulatory Visit: Payer: Self-pay | Admitting: Nurse Practitioner

## 2024-04-27 ENCOUNTER — Ambulatory Visit (INDEPENDENT_AMBULATORY_CARE_PROVIDER_SITE_OTHER): Payer: Self-pay | Admitting: Nurse Practitioner

## 2024-04-27 VITALS — BP 126/68 | HR 88 | Wt 212.0 lb

## 2024-04-27 DIAGNOSIS — Z0000 Encounter for general adult medical examination without abnormal findings: Secondary | ICD-10-CM | POA: Diagnosis not present

## 2024-04-27 DIAGNOSIS — Z13220 Encounter for screening for lipoid disorders: Secondary | ICD-10-CM | POA: Diagnosis not present

## 2024-04-27 DIAGNOSIS — Z1329 Encounter for screening for other suspected endocrine disorder: Secondary | ICD-10-CM

## 2024-04-27 NOTE — Progress Notes (Signed)
 Subjective   Patient ID: James [name], male    DOB: 2000/09/14, 23 y.o.   MRN: [id number]  Chief Complaint  Patient presents with   Annual Exam    Referring provider: No ref. provider found  James [name] is a 23 y.o. male with Past Medical History: No date: Anxiety No date: History of depression 02/2019: Seasonal allergies   HPI  Patient presents today for physical.  Overall he has been doing well since last visit.  No new issues or concerns today.  We will check blood work today. Denies f/c/s, n/v/d, hemoptysis, PND, leg swelling Denies chest pain or edema    Allergies  Allergen Reactions   Latex Itching    Immunization History  Administered Date(s) Administered   DTaP 08/29/2001, 10/30/2001, 12/22/2001, 08/21/2002, 03/05/2005   HIB, Unspecified 08/29/2001, 10/30/2001, 12/22/2001, 04/26/2002   HPV Quadrivalent 01/19/2013, 03/26/2014   Hepatitis A, Ped/Adol-2 Dose 03/28/2006, 09/28/2007   Hepatitis B, PED/ADOLESCENT 07-22-00, 08/29/2001, 12/22/2001   IPV 08/29/2001, 10/30/2001, 08/21/2002, 03/05/2005   Influenza,inj,Quad PF,6+ Mos 05/08/2002   MMR 04/26/2002, 03/05/2005   Meningococcal Acwy, Unspecified 01/19/2013, 01/20/2017   Meningococcal B, OMV 01/20/2017, 09/06/2017   Pneumococcal Conjugate PCV 7 08/29/2001, 10/30/2001, 12/22/2001, 04/26/2002   Tdap 01/17/2012   Varicella 04/26/2002, 03/28/2006    Tobacco History: Social History   Tobacco Use  Smoking Status Never  Smokeless Tobacco Never   Counseling given: Not Answered   Outpatient Encounter Medications as of 04/27/2024  Medication Sig   fexofenadine  (ALLEGRA  ALLERGY) 180 MG tablet Take 1 tablet (180 mg total) by mouth daily.   cetirizine  (ZYRTEC ) 10 MG tablet Take 1 tablet (10 mg total) by mouth daily. (Patient not taking: Reported on 04/27/2024)   FLUoxetine (PROZAC) 20 MG capsule Take by mouth. (Patient not taking: Reported on 04/27/2024)   ibuprofen  (ADVIL ) 200 MG tablet Take 200 mg by  mouth every 6 (six) hours as needed. (Patient not taking: Reported on 04/27/2024)   meloxicam (MOBIC) 7.5 MG tablet Take 7.5-15 mg by mouth daily. (Patient not taking: Reported on 04/27/2024)   [DISCONTINUED] fluticasone  (FLONASE ) 50 MCG/ACT nasal spray Place 1 spray into both nostrils daily. (Patient not taking: Reported on 09/27/2019)   No facility-administered encounter medications on file as of 04/27/2024.    Review of Systems  Review of Systems  Constitutional: Negative.   HENT: Negative.    Cardiovascular: Negative.   Gastrointestinal: Negative.   Allergic/Immunologic: Negative.   Neurological: Negative.   Psychiatric/Behavioral: Negative.       Objective:   BP 126/68 (BP Location: Left Arm, Patient Position: Sitting, Cuff Size: Large)   Pulse 88   Wt 212 lb (96.2 kg)   SpO2 95%   BMI 28.75 kg/m   Wt Readings from Last 5 Encounters:  04/27/24 212 lb (96.2 kg)  04/15/23 204 lb (92.5 kg)  03/17/22 196 lb 3.2 oz (89 kg)  08/22/20 194 lb (88 kg) (90%, Z= 1.28)*  11/23/19 184 lb (83.5 kg) (86%, Z= 1.09)*   * Growth percentiles are based on CDC (Boys, 2-20 Years) data.     Physical Exam Vitals and nursing note reviewed.  Constitutional:      General: He is not in acute distress.    Appearance: He is well-developed.  Cardiovascular:     Rate and Rhythm: Normal rate and regular rhythm.  Pulmonary:     Effort: Pulmonary effort is normal.     Breath sounds: Normal breath sounds.  Skin:    General: Skin is warm and dry.  Neurological:     Mental Status: He is alert and oriented to person, place, and time.       Assessment & Plan:   Lipid screening -     Lipid panel  Routine adult health maintenance -     CBC -     Comprehensive metabolic panel with GFR  Thyroid disorder screen -     TSH     Return in about 1 year (around 04/27/2025) for Physical.   Bascom GORMAN Borer, NP 04/27/2024

## 2024-04-28 LAB — COMPREHENSIVE METABOLIC PANEL WITH GFR
ALT: 24 IU/L (ref 0–44)
AST: 20 IU/L (ref 0–40)
Albumin: 4.5 g/dL (ref 4.3–5.2)
Alkaline Phosphatase: 66 IU/L (ref 47–123)
BUN/Creatinine Ratio: 10 (ref 9–20)
BUN: 11 mg/dL (ref 6–20)
Bilirubin Total: 1.3 mg/dL — ABNORMAL HIGH (ref 0.0–1.2)
CO2: 25 mmol/L (ref 20–29)
Calcium: 9.5 mg/dL (ref 8.7–10.2)
Chloride: 103 mmol/L (ref 96–106)
Creatinine, Ser: 1.07 mg/dL (ref 0.76–1.27)
Globulin, Total: 2.3 g/dL (ref 1.5–4.5)
Glucose: 79 mg/dL (ref 70–99)
Potassium: 4.7 mmol/L (ref 3.5–5.2)
Sodium: 139 mmol/L (ref 134–144)
Total Protein: 6.8 g/dL (ref 6.0–8.5)
eGFR: 100 mL/min/1.73 (ref >59)

## 2024-04-28 LAB — CBC
Hematocrit: 47.6 % (ref 37.5–51.0)
Hemoglobin: 15.6 g/dL (ref 13.0–17.7)
MCH: 30.6 pg (ref 26.6–33.0)
MCHC: 32.8 g/dL (ref 31.5–35.7)
MCV: 94 fL (ref 79–97)
Platelets: 232 x10E3/uL (ref 150–450)
RBC: 5.09 x10E6/uL (ref 4.14–5.80)
RDW: 12.1 % (ref 11.6–15.4)
WBC: 5.9 x10E3/uL (ref 3.4–10.8)

## 2024-04-28 LAB — TSH: TSH: 0.889 u[IU]/mL (ref 0.450–4.500)

## 2024-04-28 LAB — LIPID PANEL
Chol/HDL Ratio: 3.5 ratio (ref 0.0–5.0)
Cholesterol, Total: 166 mg/dL (ref 100–199)
HDL: 48 mg/dL (ref >39)
LDL Chol Calc (NIH): 101 mg/dL — ABNORMAL HIGH (ref 0–99)
Triglycerides: 90 mg/dL (ref 0–149)
VLDL Cholesterol Cal: 17 mg/dL (ref 5–40)

## 2024-04-30 ENCOUNTER — Ambulatory Visit: Payer: Self-pay | Admitting: Nurse Practitioner

## 2025-04-29 ENCOUNTER — Encounter: Payer: Self-pay | Admitting: Nurse Practitioner
